# Patient Record
Sex: Male | Born: 1975 | Race: White | Hispanic: No | Marital: Married | Smoking: Current every day smoker
Health system: Southern US, Community
[De-identification: ages and names within clinical notes are randomized; demographics above are authoritative.]

## PROBLEM LIST (undated history)

## (undated) DIAGNOSIS — I10 Essential (primary) hypertension: Secondary | ICD-10-CM

## (undated) DIAGNOSIS — E079 Disorder of thyroid, unspecified: Secondary | ICD-10-CM

## (undated) DIAGNOSIS — E785 Hyperlipidemia, unspecified: Secondary | ICD-10-CM

## (undated) HISTORY — DX: Disorder of thyroid, unspecified: E07.9

## (undated) HISTORY — DX: Hyperlipidemia, unspecified: E78.5

## (undated) HISTORY — DX: Essential (primary) hypertension: I10

---

## 2014-10-29 ENCOUNTER — Ambulatory Visit: Payer: Self-pay | Admitting: Sports Medicine

## 2015-03-22 ENCOUNTER — Ambulatory Visit (INDEPENDENT_AMBULATORY_CARE_PROVIDER_SITE_OTHER): Payer: Managed Care, Other (non HMO) | Admitting: Sports Medicine

## 2015-03-22 ENCOUNTER — Encounter: Payer: Self-pay | Admitting: Sports Medicine

## 2015-03-22 ENCOUNTER — Ambulatory Visit (INDEPENDENT_AMBULATORY_CARE_PROVIDER_SITE_OTHER): Payer: Managed Care, Other (non HMO)

## 2015-03-22 VITALS — BP 128/81 | HR 84 | Ht 68.0 in | Wt 226.0 lb

## 2015-03-22 DIAGNOSIS — Z0000 Encounter for general adult medical examination without abnormal findings: Secondary | ICD-10-CM | POA: Insufficient documentation

## 2015-03-22 DIAGNOSIS — M5412 Radiculopathy, cervical region: Secondary | ICD-10-CM | POA: Insufficient documentation

## 2015-03-22 DIAGNOSIS — R05 Cough: Secondary | ICD-10-CM

## 2015-03-22 DIAGNOSIS — Z720 Tobacco use: Secondary | ICD-10-CM | POA: Diagnosis not present

## 2015-03-22 DIAGNOSIS — E785 Hyperlipidemia, unspecified: Secondary | ICD-10-CM

## 2015-03-22 DIAGNOSIS — F17200 Nicotine dependence, unspecified, uncomplicated: Secondary | ICD-10-CM | POA: Insufficient documentation

## 2015-03-22 DIAGNOSIS — R053 Chronic cough: Secondary | ICD-10-CM

## 2015-03-22 MED ORDER — CYCLOBENZAPRINE HCL 10 MG PO TABS: ORAL_TABLET | ORAL | Status: DC

## 2015-03-22 MED ORDER — MELOXICAM 15 MG PO TABS: ORAL_TABLET | ORAL | Status: AC

## 2015-03-22 MED ORDER — PREDNISONE 50 MG PO TABS: ORAL_TABLET | ORAL | Status: DC

## 2015-03-22 NOTE — Assessment & Plan Note (Signed)
Ordering routine blood work. 

## 2015-03-22 NOTE — Progress Notes (Signed)
  Subjective:    CC: Establish care.   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 He has a few complaints.  Neck and shoulder pain: left sided, periscapular with C7 and C6 distribution radiculopathy to the left hand. Moderate, persistent, better with abduction of the left shoulder, nothing in the lower extremity, no trauma.  Smoker: Not yet ready to quit.  Chronic cough: Present for over one month, nonproductive. No weight loss, fevers, chills, night sweats.  Past medical history, Surgical history, Family history not pertinant except as noted below, Social history, Allergies, and medications have been entered into the medical record, reviewed, and no changes needed.   Review of Systems: No headache, visual changes, nausea, vomiting, diarrhea, constipation, dizziness, abdominal pain, skin rash, fevers, chills, night sweats, swollen lymph nodes, weight loss, chest pain, body aches, joint swelling, muscle aches, shortness of breath, mood changes, visual or auditory hallucinations.  Objective:    General: Well Developed, well nourished, and in no acute distress.  Neuro: Alert and oriented x3, extra-ocular muscles intact, sensation grossly intact.  HEENT: Normocephalic, atraumatic, pupils equal round reactive to light, neck supple, no masses, no lymphadenopathy, thyroid nonpalpable.  Skin: Warm and dry, no rashes noted.  Cardiac: Regular rate and rhythm, no murmurs rubs or gallops.  Respiratory: Clear to auscultation bilaterally. Not using accessory muscles, speaking in full sentences.  Abdominal: Soft, nontender, nondistended, positive bowel sounds, no masses, no organomegaly.  Neck: Positive spurling's with left-sided C6 and C7 radiculopathy Full neck range of motion Grip strength and sensation normal in bilateral hands Strength good C4 to T1 distribution No sensory change to C4 to T1 Reflexes normal.  x-rays reviewed and shows C4-C5 foraminal  encroachment  From the facet  Impression and Recommendations:    The patient was counselled, risk factors were discussed, anticipatory guidance given.

## 2015-03-22 NOTE — Assessment & Plan Note (Signed)
Prednisone, meloxicam, formal physical therapy, x-rays, Flexeril at bedtime. Return in one month, MRI for interventional planning if no better.

## 2015-03-22 NOTE — Assessment & Plan Note (Signed)
Precontemplative

## 2015-03-22 NOTE — Assessment & Plan Note (Signed)
Chest x-ray, return for pre-and postbronchodilator spirometry.

## 2015-04-03 DIAGNOSIS — E785 Hyperlipidemia, unspecified: Secondary | ICD-10-CM | POA: Insufficient documentation

## 2015-04-03 LAB — COMPREHENSIVE METABOLIC PANEL
AST: 18 U/L (ref 0–37)
Albumin: 4.5 g/dL (ref 3.5–5.2)
Alkaline Phosphatase: 60 U/L (ref 39–117)
BUN: 22 mg/dL (ref 6–23)
Calcium: 9.7 mg/dL (ref 8.4–10.5)
Creat: 1.2 mg/dL (ref 0.50–1.35)
Glucose, Bld: 92 mg/dL (ref 70–99)
Total Bilirubin: 0.8 mg/dL (ref 0.2–1.2)

## 2015-04-03 LAB — TSH: TSH: 2.887 u[IU]/mL (ref 0.350–4.500)

## 2015-04-03 LAB — CBC
HCT: 50.9 % (ref 39.0–52.0)
Hemoglobin: 17.3 g/dL — ABNORMAL HIGH (ref 13.0–17.0)
MCH: 28.9 pg (ref 26.0–34.0)
MCHC: 34 g/dL (ref 30.0–36.0)
MCV: 85.1 fL (ref 78.0–100.0)
MPV: 10.3 fL (ref 8.6–12.4)
Platelets: 246 10*3/uL (ref 150–400)
RBC: 5.98 MIL/uL — ABNORMAL HIGH (ref 4.22–5.81)
RDW: 14 % (ref 11.5–15.5)
WBC: 8.6 10*3/uL (ref 4.0–10.5)

## 2015-04-03 LAB — HEMOGLOBIN A1C
Hgb A1c MFr Bld: 5.4 % (ref <5.7)
Mean Plasma Glucose: 108 mg/dL (ref <117)

## 2015-04-03 LAB — LIPID PANEL
Cholesterol: 254 mg/dL — ABNORMAL HIGH (ref 0–200)
HDL: 41 mg/dL (ref >=40)
LDL Cholesterol: 162 mg/dL — ABNORMAL HIGH (ref 0–99)
Total CHOL/HDL Ratio: 6.2 ratio
Triglycerides: 257 mg/dL — ABNORMAL HIGH (ref <150)
VLDL: 51 mg/dL — ABNORMAL HIGH (ref 0–40)

## 2015-04-03 LAB — TESTOSTERONE, FREE, TOTAL, SHBG
Sex Hormone Binding: 26 nmol/L (ref 10–50)
Testosterone, Free: 115.1 pg/mL (ref 47.0–244.0)
Testosterone-% Free: 2.4 % (ref 1.6–2.9)
Testosterone: 484 ng/dL (ref 300–890)

## 2015-04-03 LAB — COMPREHENSIVE METABOLIC PANEL WITH GFR
ALT: 35 U/L (ref 0–53)
CO2: 28 meq/L (ref 19–32)
Chloride: 99 meq/L (ref 96–112)
Potassium: 4.9 meq/L (ref 3.5–5.3)
Sodium: 135 meq/L (ref 135–145)
Total Protein: 7.3 g/dL (ref 6.0–8.3)

## 2015-04-03 LAB — VITAMIN D 25 HYDROXY (VIT D DEFICIENCY, FRACTURES): Vit D, 25-Hydroxy: 43 ng/mL (ref 30–100)

## 2015-04-04 ENCOUNTER — Ambulatory Visit: Payer: Managed Care, Other (non HMO)

## 2015-04-15 ENCOUNTER — Other Ambulatory Visit: Payer: Managed Care, Other (non HMO) | Admitting: Sports Medicine

## 2016-05-13 ENCOUNTER — Ambulatory Visit: Payer: Worker's Compensation

## 2016-05-13 ENCOUNTER — Ambulatory Visit (INDEPENDENT_AMBULATORY_CARE_PROVIDER_SITE_OTHER): Payer: Worker's Compensation | Admitting: Physician Assistant

## 2016-05-13 ENCOUNTER — Other Ambulatory Visit: Payer: Self-pay | Admitting: Radiology

## 2016-05-13 VITALS — BP 128/80 | HR 71 | Temp 98.4°F | Resp 18 | Ht 68.0 in | Wt 218.0 lb

## 2016-05-13 DIAGNOSIS — S46911A Strain of unspecified muscle, fascia and tendon at shoulder and upper arm level, right arm, initial encounter: Secondary | ICD-10-CM

## 2016-05-13 DIAGNOSIS — M25511 Pain in right shoulder: Secondary | ICD-10-CM

## 2016-05-13 DIAGNOSIS — M5412 Radiculopathy, cervical region: Secondary | ICD-10-CM

## 2016-05-13 MED ORDER — CYCLOBENZAPRINE HCL 10 MG PO TABS: ORAL_TABLET | ORAL | 0 refills | Status: AC

## 2016-05-13 MED ORDER — MELOXICAM 15 MG PO TABS: 15.0000 mg | ORAL_TABLET | Freq: Every day | ORAL | 0 refills | Status: DC

## 2016-05-13 MED ORDER — CYCLOBENZAPRINE HCL 10 MG PO TABS: 10.0000 mg | ORAL_TABLET | Freq: Three times a day (TID) | ORAL | 0 refills | Status: AC | PRN

## 2016-05-13 MED ORDER — MELOXICAM 15 MG PO TABS: 15.0000 mg | ORAL_TABLET | Freq: Every day | ORAL | 0 refills | Status: AC

## 2016-05-13 NOTE — Progress Notes (Signed)
Gary Clay Jan 25, 1976 40 y.o.   Chief Complaint  Patient presents with  . Shoulder Injury    today, right     Date of Injury: today 4pm 7.26.2017  History of Present Illness:  Presents for evaluation of work-related complaint.  --Patient was using an ear hammer, and push through tool went into shoulder--so he had felt, though his coworkers did not visualize this.  pain was instant, and he dropped down.  It is at the shoulder and the arm of his left side which he describes as shooting.  No numbness or tingling.  He has noticed no bleeding or bruising.  There is significant muscle spasms at the right shoulder.  Marland Kitchen   Anterior arm at the bicep and down.  .    ROS ROS otherwise unremarkable unless listed above.   Current medications and allergies reviewed and updated. Past medical history, family history, social history have been reviewed and updated.   Physical Exam  Constitutional: He is well-developed, well-nourished, and in no distress. No distress.  Eyes: Conjunctivae are normal. Pupils are equal, round, and reactive to light.  Cardiovascular: Normal rate and regular rhythm.   Pulmonary/Chest: Effort normal. No respiratory distress. He has no wheezes.  Musculoskeletal:       Cervical back: He exhibits normal range of motion, no tenderness and no bony tenderness.  No erythema, swelling, or abrasion found.  Pectoral tenerness just adjacent to the right shoulder.  Biceps tendon pain.  No swelling at the bicep.  Decreased range of motion secondary to pain.   Skin: Skin is warm and dry. He is not diaphoretic.  Psychiatric: Mood and affect normal.   Dg Shoulder Right  Result Date: 05/13/2016 CLINICAL DATA:  Right shoulder pain EXAM: RIGHT SHOULDER - 2+ VIEW COMPARISON:  None. FINDINGS: No evidence of acute fracture or dislocation. Mild irregularity involving the distal/lateral clavicle and AC joint, well corticated and likely chronic, suggesting the sequela of prior trauma.  Visualized soft tissues are within normal limits. Visualized right lung is clear. IMPRESSION: Chronic changes involving the AC joint, suggesting the sequela of prior trauma. No evidence of acute fracture or dislocation. Electronically Signed   By: Charline Bills M.D.   On: 05/13/2016 18:19  Assessment and Plan: 40 year old male is here today for shoulder and right upper arm pain. Given muscle relaxant and nsaid.  rtc in 3 days for follow up.  Possible biceps tendon, pectoral strain Right shoulder pain - Plan: DG Shoulder Right, cyclobenzaprine (FLEXERIL) 10 MG tablet, DISCONTINUED: meloxicam (MOBIC) 15 MG tablet  Muscle strain of right upper arm, initial encounter - Plan: DG Shoulder Right, cyclobenzaprine (FLEXERIL) 10 MG tablet, DISCONTINUED: meloxicam (MOBIC) 15 MG tablet  Trena Platt, PA-C [facility] 8/13/20178:07 PM

## 2016-05-13 NOTE — Patient Instructions (Addendum)
     IF you received an x-ray today, you will receive an invoice from [facility]. Please contact [facility] at [phone] with questions or concerns regarding your invoice.   IF you received labwork today, you will receive an invoice from [employer]. Please contact [facility] at [phone] with questions or concerns regarding your invoice.   Our billing staff will not be able to assist you with questions regarding bills from these companies.  You will be contacted with the lab results as soon as they are available. The fastest way to get your results is to activate your My Chart account. Instructions are located on the last page of this paperwork. If you have not heard from [place] regarding the results in 2 weeks, please contact this office.     Please take the mobic daily.  Do not take the ibuprofen, or the naproxen daily.  You are able to take tylenol. Please take the flexeril as needed.  You are able to split it in half.   Stay in the splint.  Make sure you are placing your weight into the splint.   If your shoulder is feeling a lot better, you may come out of the splint and do the range of motion as we discussed (pendulum, wall climbing).

## 2016-05-16 ENCOUNTER — Ambulatory Visit (INDEPENDENT_AMBULATORY_CARE_PROVIDER_SITE_OTHER): Payer: Worker's Compensation | Admitting: Family Medicine

## 2016-05-16 VITALS — BP 116/76 | HR 81 | Temp 98.2°F | Resp 18 | Ht 68.0 in | Wt 219.2 lb

## 2016-05-16 DIAGNOSIS — R200 Anesthesia of skin: Secondary | ICD-10-CM

## 2016-05-16 DIAGNOSIS — T148XXA Other injury of unspecified body region, initial encounter: Secondary | ICD-10-CM

## 2016-05-16 DIAGNOSIS — M25511 Pain in right shoulder: Secondary | ICD-10-CM

## 2016-05-16 DIAGNOSIS — R202 Paresthesia of skin: Secondary | ICD-10-CM

## 2016-05-16 NOTE — Patient Instructions (Addendum)
   I have ordered an MRI of your shoulder, hopefully this can be done early next week, as well as a referral to orthopedics. For now continue the shoulder sling, meloxicam if needed for pain, and Flexeril up to every 8 hours. If you need something stronger for pain, please let me know.  Continue to avoid use of right arm at work. If you have not seen orthopedics within 1 week, return to recheck with me.  IF you received an x-ray today, you will receive an invoice from [facility]. Please contact [facility] at [phone] with questions or concerns regarding your invoice.   IF you received labwork today, you will receive an invoice from [employer]. Please contact [facility] at [phone] with questions or concerns regarding your invoice.   Our billing staff will not be able to assist you with questions regarding bills from these companies.  You will be contacted with the lab results as soon as they are available. The fastest way to get your results is to activate your My Chart account. Instructions are located on the last page of this paperwork. If you have not heard from [place] regarding the results in 2 weeks, please contact this office.

## 2016-05-16 NOTE — Progress Notes (Addendum)
Subjective:  By signing my name below, I, [name], attest that this documentation has been prepared under the direction and in the presence of Meredith Staggers, MD. Electronically Signed: [name], Scribe. 05/16/2016 , 2:19 PM .  Patient was seen in Room 4 .   Patient ID: Gary Clay, male    DOB: [birth year], 40 y.o.   MRN: [id number] Chief Complaint  Patient presents with   Follow-up    W/C follow up for right shoulder pain   HPI Gary Clay is a 40 y.o. male Here for follow up of right shoulder pain due to injury at work. Date of injury 05/13/16. He was seen 05/13/16 by Trena Platt, PA. I was also asked to evaluate his shoulder. He felt immediate pain in right shoulder after using an air hammer with pain extending into the right upper arm. He was placed into a sling, started meloxicam 15mg  qd, flexeril tid, and work restrictions (no use of the right arm). He had an xray of his right shoulder, indicating chronic changes of the [facility] joint but no acute fracture or dislocation.   Patient has been taking meloxicam qd and flexeril tid as instructed. He's had improvement but specific movements causes him pain; ie, when he's reaching back behind him. He mentions having sedation with the medications. He's also been wearing his sling. He's noticed bruising over his right upper arm now.   He works as a Teaching laboratory technician.   No Known Allergies   Prior to Admission medications   Medication Sig Start Date End Date Taking? Authorizing Provider  cyclobenzaprine (FLEXERIL) 10 MG tablet Take 1 tablet (10 mg total) by mouth 3 (three) times daily as needed for muscle spasms. 05/13/16  Yes Collie Siad English, PA  cyclobenzaprine (FLEXERIL) 10 MG tablet One half tab PO qHS, then increase gradually to one tab TID. 05/13/16  Yes Collie Siad English, PA  meloxicam (MOBIC) 15 MG tablet Take 1 tablet (15 mg total) by mouth daily. 05/13/16  Yes Stephanie D English, PA  meloxicam (MOBIC) 15 MG tablet One  tab PO qAM with breakfast for 2 weeks, then daily prn pain. Patient not taking: Reported on 05/13/2016 03/22/15   Monica Becton, MD   Review of Systems  Constitutional: Negative for chills, fatigue and fever.  Gastrointestinal: Negative for diarrhea, nausea and vomiting.  Musculoskeletal: Positive for arthralgias and myalgias. Negative for back pain.  Neurological: Positive for numbness. Negative for dizziness, weakness and headaches.       Objective:   Physical Exam  Constitutional: He is oriented to person, place, and time. He appears well-developed and well-nourished. No distress.  HENT:  Head: Normocephalic and atraumatic.  Eyes: EOM are normal. Pupils are equal, round, and reactive to light.  Neck: Neck supple.  Cardiovascular: Normal rate.   Pulmonary/Chest: Effort normal. No respiratory distress.  Musculoskeletal: Normal range of motion.  Visual exam: some asymmetry with possible slight sulcus anterior right shoulder, some ecchymosis anterior right upper arm overlying the proximal biceps.  Upper pectoralis non tender, tenderness in right axilla, has some decreased sensation right anterior upper arm, skin intact.  Clavicle,  and AC non tender, full flexion but pain with supination of arm, positive speed test, positive yergason,  tightness with abduction, guarded internal rotation, negative empty can test, NVI distally  Neurological: He is alert and oriented to person, place, and time.  Skin: Skin is warm and dry.  Psychiatric: He has a normal mood and affect. His behavior is normal.  Nursing note and vitals reviewed.   Vitals:   05/16/16 1339  BP: 116/76  Pulse: 81  Resp: 18  Temp: 98.2 F (36.8 C)  TempSrc: Oral  SpO2: 98%  Weight: 219 lb 3.2 oz (99.4 kg)  Height:  (1.727 m)      Assessment & Plan:     Gary Clay is a 40 y.o. male Pain in joint of right shoulder - Plan: MR Shoulder Right Wo Contrast, Ambulatory referral to Orthopedic  Surgery  Numbness and tingling of right upper extremity - Plan: MR Shoulder Right Wo Contrast, Ambulatory referral to Orthopedic Surgery  Bruising - Plan: MR Shoulder Right Wo Contrast, Ambulatory referral to Orthopedic Surgery  Here for follow-up of injury sustained at work on July 26. Right shoulder pain with bruising now noted and some anterior upper arm dysesthesia. No apparent fracture or acute findings on initial shoulder x-ray. Neurovascular intact distally. Based on exam and location of ecchymosis, differential includes labral tear, or biceps tendon tear proximally with labral tear.  RTC tear also possible.  - MRI of right shoulder ordered, will also start referral to orthopedics for further evaluation.  -Continue shoulder sling, meloxicam and Flexeril as needed. Advised let me know if he needs something stronger for pain in the interim.  -RTC precautions discussed, handout given and letter for work provided including precautions if sedating medications taken while working.  No orders of the defined types were placed in this encounter.  Patient Instructions     I have ordered an MRI of your shoulder, hopefully this can be done early next week, as well as a referral to orthopedics. For now continue the shoulder sling, meloxicam if needed for pain, and Flexeril up to every 8 hours. If you need something stronger for pain, please let me know.  Continue to avoid use of right arm at work. If you have not seen orthopedics within 1 week, return to recheck with me.  IF you received an x-ray today, you will receive an invoice from [facility]. Please contact [facility] at [phone] with questions or concerns regarding your invoice.   IF you received labwork today, you will receive an invoice from [employer]. Please contact [facility] at [phone] with questions or concerns regarding your invoice.   Our billing staff will not be able to assist  you with questions regarding bills from these companies.  You will be contacted with the lab results as soon as they are available. The fastest way to get your results is to activate your My Chart account. Instructions are located on the last page of this paperwork. If you have not heard from [place] regarding the results in 2 weeks, please contact this office.        I personally performed the services described in this documentation, which was scribed in my presence. The recorded information has been reviewed and considered, and addended by me as needed.   Signed,   Meredith Staggers, MD [facility].  05/16/16 2:47 PM

## 2016-05-19 ENCOUNTER — Telehealth: Payer: Self-pay

## 2016-05-19 NOTE — Telephone Encounter (Signed)
The patient's wife called for advise on the patient's condition.  She said the bruising has gotten worse - it is now also around the back of his arm, and onto his back.  She wants to know if there is a cause for concern.  Please advise, thank you.  CB# [phone]

## 2016-05-20 NOTE — Telephone Encounter (Signed)
Spoke with pts wife and she stated they are seeing orthopedist today.

## 2016-05-23 ENCOUNTER — Ambulatory Visit
Admission: RE | Admit: 2016-05-23 | Discharge: 2016-05-23 | Disposition: A | Discharge status: Home / Self Care | Payer: Worker's Compensation | Source: Ambulatory Visit | Attending: Family Medicine | Admitting: Family Medicine

## 2016-05-23 DIAGNOSIS — M25511 Pain in right shoulder: Secondary | ICD-10-CM

## 2016-05-23 DIAGNOSIS — R202 Paresthesia of skin: Secondary | ICD-10-CM

## 2016-05-23 DIAGNOSIS — T148XXA Other injury of unspecified body region, initial encounter: Secondary | ICD-10-CM

## 2016-05-23 DIAGNOSIS — R200 Anesthesia of skin: Secondary | ICD-10-CM

## 2016-05-23 DIAGNOSIS — S29011A Strain of muscle and tendon of front wall of thorax, initial encounter: Secondary | ICD-10-CM

## 2016-05-26 ENCOUNTER — Other Ambulatory Visit: Payer: Self-pay | Admitting: Family Medicine

## 2016-05-26 DIAGNOSIS — S29011A Strain of muscle and tendon of front wall of thorax, initial encounter: Secondary | ICD-10-CM

## 2016-05-30 ENCOUNTER — Other Ambulatory Visit: Payer: Self-pay | Admitting: Specialist

## 2016-05-30 DIAGNOSIS — S4991XD Unspecified injury of right shoulder and upper arm, subsequent encounter: Secondary | ICD-10-CM

## 2016-06-05 ENCOUNTER — Ambulatory Visit
Admission: RE | Admit: 2016-06-05 | Discharge: 2016-06-05 | Disposition: A | Discharge status: Home / Self Care | Payer: Self-pay | Source: Ambulatory Visit | Attending: Specialist | Admitting: Specialist

## 2016-06-05 DIAGNOSIS — S4991XD Unspecified injury of right shoulder and upper arm, subsequent encounter: Secondary | ICD-10-CM

## 2016-08-13 ENCOUNTER — Ambulatory Visit: Payer: Self-pay | Attending: Specialist | Admitting: Physical Therapy

## 2016-08-13 DIAGNOSIS — M25611 Stiffness of right shoulder, not elsewhere classified: Secondary | ICD-10-CM | POA: Insufficient documentation

## 2016-08-13 DIAGNOSIS — M25511 Pain in right shoulder: Secondary | ICD-10-CM | POA: Insufficient documentation

## 2016-08-13 NOTE — Patient Instructions (Signed)
Pendulum Circular    Bend forward 90 at waist, leaning on table for support. Rock body in a circular pattern to move arm clockwise __10-15__ times then counterclockwise __10-15__ times. Do __2-3__ sessions per day.  Copyright  VHI. All rights reserved.   Pendulum Forward/Back    Bend forward 90 at waist, using table for support. Rock body forward and back to swing arm. Repeat __10-15__ times. Do _10-15___ sessions per day.  Copyright  VHI. All rights reserved.   Pendulum Side to Side    Bend forward 90 at waist, leaning on table for support. Rock body from side to side and let arm swing freely. Repeat _10-15___ times. Do __2-3__ sessions per day.  Copyright  VHI. All rights reserved.     When arm is out of sling bend and straighten elbow, move wrist, squeeze stress ball.

## 2016-08-13 NOTE — Therapy (Signed)
[facility] [address]  [address] [place], [place], [address] Phone: [phone]   Fax:  [phone]  Physical Therapy Evaluation  Patient Details  Name: Gary Clay MRN: [id number] Date of Birth: 05/23/1976 Referring Provider: Dr. Elana Alm. Collins  Encounter Date: 08/13/2016      PT End of Session - 08/13/16 1411    Visit Number 1   Number of Visits 16   Date for PT Re-Evaluation 10/08/16   Authorization Type self pay   PT Start Time 1323   PT Stop Time 1400   PT Time Calculation (min) 37 min   Activity Tolerance Patient tolerated treatment well   Behavior During Therapy WFL for tasks assessed/performed      Past Medical History:  Diagnosis Date  . Hyperlipidemia   . Hypertension   . Thyroid disease     No past surgical history on file.  There were no vitals filed for this visit.       Subjective Assessment - 08/13/16 1326    Subjective Patient is a 40 y/o M presenting to OPPT s/p surgical repair of pectoralis major muscle rupture on 06/30/16 after using an air hammer at work. Patient reporting only minor pain daily with ability to sleep without brace.  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    Limitations Lifting;House hold activities   Diagnostic tests MRI - tear of pec major at tendons attachment on humerus    Patient Stated Goals get back to work, regain motion   Currently in Pain? Yes   Pain Score 1    Pain Location Shoulder   Pain Orientation Right   Pain Descriptors / Indicators Aching;Dull  has periods of sharp and stabbing if moving incorrectly or if accidental muscle activation   Pain Type Surgical pain   Pain Onset More than a month ago   Pain Frequency Intermittent            [facility] PT Assessment - 08/13/16 1328      Assessment   Medical Diagnosis Rupture of pec major muscle s/p repair   Referring Provider Dr. Elana Alm. Collins   Onset  Date/Surgical Date 06/30/16   Hand Dominance Right   Next MD Visit 08/26/16     Precautions   Precaution Comments gentle ROM, no PREs or resistance until 3 mo after surgery     Balance Screen   Has the patient fallen in the past 6 months No   Has the patient had a decrease in activity level because of a fear of falling?  No   Is the patient reluctant to leave their home because of a fear of falling?  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#, wrenching, overhead activities for prolonged times      Cognition   Overall Cognitive Status Within Functional Limits for tasks assessed     Observation/Other Assessments   Focus on Therapeutic Outcomes (FOTO)  51 (49% limited, predcited 37% limited)     PROM   PROM Assessment Site Shoulder   Right/Left Shoulder Right   Right Shoulder Extension 40 Degrees   Right Shoulder Flexion 130 Degrees   Right Shoulder ABduction 124 Degrees  Right Shoulder Internal Rotation 68 Degrees   Right Shoulder External Rotation 90 Degrees     Strength   Strength Assessment Site Elbow   Right/Left Elbow Right   Right Elbow Flexion 5/5   Right Elbow Extension 5/5                   [facility] Adult PT Treatment/Exercise - 08/13/16 1409      Self-Care   Self-Care Other Self-Care Comments   Other Self-Care Comments  instructed in elbow flexion and extension, grip strength, forearm supination/pronation     Shoulder Exercises: ROM/Strengthening   Pendulum CW/CCW, forwards/backwards, side to side; 10-15 reviewed and given as HEP     Manual Therapy   Manual Therapy Passive ROM   Passive ROM passive ROM of R shoulder into flexion, abduction, and IR for gentle stretching; 10 x 5" holds; no increased pain                 PT Education - 08/13/16 1411    Education provided Yes   Education Details initial HEP with handout given   Person(s) Educated Patient   Methods Explanation;Demonstration;Handout   Comprehension Verbalized understanding;Returned demonstration          PT Short Term Goals - 08/13/16 1423      PT SHORT TERM GOAL #1   Title patient to be independent with HEP (08/27/16)   Time 2   Period Weeks   Status New     PT SHORT TERM GOAL #2   Title Patient to demonstrate R shoulder PROM to WNL (09/10/16)   Time 4   Period Weeks   Status New           PT Long Term Goals - 08/13/16 1424      PT LONG TERM GOAL #1   Title Patient to be independent with advanced HEP (10/08/16)   Time 8   Period Weeks   Status New     PT LONG TERM GOAL #2   Title Patient to demonstrate full R shoulder AROM to facilitate improved functional mobility (10/08/16)   Time 8   Period Weeks   Status New     PT LONG TERM GOAL #3   Title Patient to tolerate overhead activity for 1 minute in preparation for return to work (10/08/16)   Time 8   Period Weeks   Status New               Plan - 08/13/16 1413    Clinical Impression Statement Gary Clay is a 40 y/o M presenting to OPPT s/p surgical repair of pec major muscle rupture from attachment on humerus. Patient today demonstrating reduced PROM into flexion, abduction, and IR with abduction most limited. Patient currently with precautions including gentle ROM, no PREs or resistive exercises. Messages sent to MD regarding limitations on AROM vs PROM. Patient to benefit from skilled PT intervention to address ROM and strength of R shoulder to allow patient to regain functional mobility of shoulder as well as to return to work without limitations.    Rehab Potential Good   PT Frequency 2x / week   PT Duration 8 weeks   PT Treatment/Interventions ADLs/Self Care Home Management;Cryotherapy;Electrical Stimulation;Moist Heat;Ultrasound;Therapeutic  exercise;Therapeutic activities;Patient/family education;Manual techniques;Dry needling;Taping;Vasopneumatic Device;Passive range of motion;Scar mobilization   PT Next Visit Plan progress ROM; awaiting to hear from MD for hopeful progression to AAROM and AROM   Consulted and Agree with Plan of Care Patient      Patient  will benefit from skilled therapeutic intervention in order to improve the following deficits and impairments:  Pain, Decreased strength, Decreased range of motion, Decreased activity tolerance, Increased edema, Impaired UE functional use  Visit Diagnosis: Acute pain of right shoulder - Plan: PT plan of care cert/re-cert  Stiffness of right shoulder, not elsewhere classified - Plan: PT plan of care cert/re-cert     Problem List Patient Active Problem List   Diagnosis Date Noted  . Hyperlipidemia LDL goal <100 04/03/2015  . Annual physical exam 03/22/2015  . Left cervical radiculopathy 03/22/2015  . Smoker 03/22/2015  . Chronic cough 03/22/2015    Kipp LaurenceStephanie R Advait Buice, PT, DPT 08/13/16 2:43 PM   Agree with note.  Clarita CraneStephanie F Matthews, PT, DPT 08/13/16 2:44 PM   [facility] [place] [address]  [address] [place], [place], [address] Phone: [phone]   Fax:  [phone]  Name: Frutoso Schatzhor Carvell MRN: [id number] Date of Birth: 05/18/1976

## 2016-08-17 ENCOUNTER — Ambulatory Visit: Payer: Self-pay

## 2016-08-18 ENCOUNTER — Ambulatory Visit: Payer: Self-pay

## 2016-08-20 ENCOUNTER — Ambulatory Visit: Payer: Self-pay | Attending: Specialist

## 2016-08-20 DIAGNOSIS — M25611 Stiffness of right shoulder, not elsewhere classified: Secondary | ICD-10-CM | POA: Insufficient documentation

## 2016-08-20 DIAGNOSIS — M25511 Pain in right shoulder: Secondary | ICD-10-CM | POA: Insufficient documentation

## 2016-08-20 NOTE — Therapy (Signed)
[facility] [place] [address]  [address] [place], [place], [address] Phone: [phone]   Fax:  [phone]  Physical Therapy Treatment  Patient Details  Name: Gary Clay MRN: [id number] Date of Birth: 07/09/1976 Referring Provider: Dr. Molly Madurorobert A. Collins   Encounter Date: 08/20/2016      PT End of Session - 08/20/16 1416    Visit Number 2   Number of Visits 16   Date for PT Re-Evaluation 10/08/16   Authorization Type self pay   PT Start Time 1405   PT Stop Time 1444   PT Time Calculation (min) 39 min   Activity Tolerance Patient tolerated treatment well   Behavior During Therapy WFL for tasks assessed/performed      Past Medical History:  Diagnosis Date  . Hyperlipidemia   . Hypertension   . Thyroid disease     No past surgical history on file.  There were no vitals filed for this visit.      Subjective Assessment - 08/20/16 1446    Subjective Pt. reporting chief complaint at this point is multiple pain sites in upper shoulder (UT).   Patient Stated Goals get back to work, regain motion   Currently in Pain? 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 Molly Madurorobert A. Collins    Onset Date/Surgical Date 06/30/16   Hand Dominance Right   Next MD Visit 08/26/16       Today's treatment:  Manual:  Seated R UT DTM/TPR x 10 min; this was pt.'s chief complaint today  Gentle R shoulder PROM all directions L sidelying R scapular mobs all directions x 2 min  R anterior shoulder/pec STM    Modalities: Moist heat R shoulder: supine, 10 min             PT Short Term Goals  - 08/20/16 1428      PT SHORT TERM GOAL #1   Title patient to be independent with HEP (08/27/16)   Time 2   Period Weeks   Status On-going     PT SHORT TERM GOAL #2   Title Patient to demonstrate R shoulder PROM to WNL (09/10/16)   Time 4   Period Weeks   Status On-going           PT Long Term Goals - 08/20/16 1428      PT LONG TERM GOAL #1   Title Patient to be independent with advanced HEP (10/08/16)   Time 8   Period Weeks   Status On-going     PT LONG TERM GOAL #2   Title Patient to demonstrate full R shoulder AROM to facilitate improved functional mobility (10/08/16)   Time 8   Period Weeks   Status On-going     PT LONG TERM GOAL #3   Title Patient to tolerate overhead activity for 1 minute in preparation for return to work (10/08/16)   Time 8   Period Weeks   Status On-going  Plan - 08/20/16 1427    Clinical Impression Statement Pt. chief complaint today was R UT pains which are worst, "during activities".   Significant time taken today for R UT DTM/TPR with good response and complete pain relief.  Still awaiting to hear from MD for hopeful progression to [facility], thus only PROM performed at R shoulder and scapula.  Pt. with good motion at scapula and smooth PROM at R shoulder without muscular guarding today.  Will continue pending MD answer in regards to ROM details.   PT Treatment/Interventions ADLs/Self Care Home Management;Cryotherapy;Electrical Stimulation;Moist Heat;Ultrasound;Therapeutic exercise;Therapeutic activities;Patient/family education;Manual techniques;Dry needling;Taping;Vasopneumatic Device;Passive range of motion;Scar mobilization   PT Next Visit Plan progress ROM; awaiting to hear from MD for hopeful progression to [facility]      Patient will benefit from skilled therapeutic intervention in order to improve the following deficits and impairments:  Pain, Decreased strength, Decreased range of motion, Decreased  activity tolerance, Increased edema, Impaired UE functional use  Visit Diagnosis: Acute pain of right shoulder  Stiffness of right shoulder, not elsewhere classified     Problem List Patient Active Problem List   Diagnosis Date Noted  . Hyperlipidemia LDL goal <100 04/03/2015  . Annual physical exam 03/22/2015  . Left cervical radiculopathy 03/22/2015  . Smoker 03/22/2015  . Chronic cough 03/22/2015    Kermit BaloMicah Brylyn Novakovich, PTA 08/20/16 3:45 PM  [facility] [place] [address]  [address] [place], [place], [address] Phone: [phone]   Fax:  [phone]  Name: Gary Clay MRN: [id number] Date of Birth: 03/02/1976

## 2016-08-26 ENCOUNTER — Ambulatory Visit: Payer: Self-pay | Admitting: Physical Therapy

## 2016-08-26 DIAGNOSIS — M25611 Stiffness of right shoulder, not elsewhere classified: Secondary | ICD-10-CM

## 2016-08-26 DIAGNOSIS — M25511 Pain in right shoulder: Secondary | ICD-10-CM

## 2016-08-26 NOTE — Therapy (Addendum)
[facility] [address]  [place], [place], [address] Phone: [phone]   Fax:  [phone]  Physical Therapy Treatment  Patient Details  Name: Gary Clay MRN: [id number] Date of Birth: Mar 05, 1976 Referring Provider: Dr. Herbie Baltimore A. Collins   Encounter Date: 08/26/2016      PT End of Session - 08/26/16 1447    Visit Number 3   Number of Visits 16   Date for PT Re-Evaluation 10/08/16   Authorization Type self pay   PT Start Time 1400   PT Stop Time 1441   PT Time Calculation (min) 41 min   Activity Tolerance Patient tolerated treatment well   Behavior During Therapy WFL for tasks assessed/performed      Past Medical History:  Diagnosis Date  . Hyperlipidemia   . Hypertension   . Thyroid disease     No past surgical history on file.  There were no vitals filed for this visit.      Subjective Assessment - 08/26/16 1402    Subjective Patient saw MD earlier today - reports he is out of the sling, can perform active movements of shoulder with no weights. Md wants return of normal ROM. Return to MD on 09/23/16   Limitations Lifting;House hold activities   Diagnostic tests MRI - tear of pec major at tendons attachment on humerus    Patient Stated Goals get back to work, regain motion   Currently in Pain? Yes   Pain Score 1    Pain Location Shoulder   Pain Orientation Right   Pain Descriptors / Indicators Tightness   Pain Type Surgical pain   Pain Onset More than a month ago   Pain Frequency Intermittent                         [facility] Adult PT Treatment/Exercise - 08/26/16 1404      Shoulder Exercises: Supine   External Rotation AAROM;15 reps   External Rotation Limitations with cane, near full ROM with elbow at side   Flexion AAROM;15 reps   Flexion Limitations with cane. to approx 150 degress, limited due to pain/tightness   ABduction AAROM;15 reps   ABduction Limitations with cane  to approx 75 degrees, pain/tightness limiting movement     Shoulder Exercises: Sidelying   External Rotation Right;15 reps   External Rotation Limitations full range     Shoulder Exercises: Standing   Flexion 15 reps  5-10 second hold .   Flexion Limitations wall ladder   Retraction 15 reps  5 second hold     Shoulder Exercises: Pulleys   Flexion 3 minutes   ABduction 3 minutes     Shoulder Exercises: Therapy Ball   Flexion 15 reps   Flexion Limitations gentle overpressure at end range; orange physioball on mat table with patient seated                PT Education - 08/26/16 1446    Education provided Yes   Education Details progress to cane AAROM, perform wall wash at home   Person(s) Educated Patient   Methods Explanation;Demonstration   Comprehension Verbalized understanding;Returned demonstration          PT Short Term Goals - 08/20/16 1428      PT SHORT TERM GOAL #1   Title patient to be independent with HEP (08/27/16)   Time 2   Period Weeks   Status On-going  PT SHORT TERM GOAL #2   Title Patient to demonstrate R shoulder PROM to WNL (09/10/16)   Time 4   Period Weeks   Status On-going           PT Long Term Goals - 08/20/16 1428      PT LONG TERM GOAL #1   Title Patient to be independent with advanced HEP (10/08/16)   Time 8   Period Weeks   Status On-going     PT LONG TERM GOAL #2   Title Patient to demonstrate full R shoulder AROM to facilitate improved functional mobility (10/08/16)   Time 8   Period Weeks   Status On-going     PT LONG TERM GOAL #3   Title Patient to tolerate overhead activity for 1 minute in preparation for return to work (10/08/16)   Time 8   Period Weeks   Status On-going               Plan - 08/26/16 1448    Clinical Impression Statement Patient returned to MD today for 8 week follow-up, with instructions to begin AA/AROM as tolerated with continued restrictions regarding no resistive or  weighted exercise - patient also to no longer be in sling. PT attempted to call MD again today, prior to start of treatment session to clarify ROM orders, with no response from MD, however, patient verbalizing to PT he is free to move R UE actively with no weights. Patient initiating AA/AROM activities with AA abduction being most limited. Patient with near full flexion and ER AAROM. Patient to continue to benefit from PT to progress functional use of R UE.    PT Treatment/Interventions ADLs/Self Care Home Management;Cryotherapy;Electrical Stimulation;Moist Heat;Ultrasound;Therapeutic exercise;Therapeutic activities;Patient/family education;Manual techniques;Dry needling;Taping;Vasopneumatic Device;Passive range of motion;Scar mobilization   PT Next Visit Plan progress ROM towards more active movements   Consulted and Agree with Plan of Care Patient      Patient will benefit from skilled therapeutic intervention in order to improve the following deficits and impairments:  Pain, Decreased strength, Decreased range of motion, Decreased activity tolerance, Increased edema, Impaired UE functional use  Visit Diagnosis: Acute pain of right shoulder  Stiffness of right shoulder, not elsewhere classified     Problem List Patient Active Problem List   Diagnosis Date Noted  . Hyperlipidemia LDL goal <100 04/03/2015  . Annual physical exam 03/22/2015  . Left cervical radiculopathy 03/22/2015  . Smoker 03/22/2015  . Chronic cough 03/22/2015      Lanney Gins, PT, DPT 08/26/16 2:56 PM   PHYSICAL THERAPY DISCHARGE SUMMARY  Visits from Start of Care: 3  Current functional level related to goals / functional outcomes: See above; limited ROM and strength - unable to assess due to patient not returning since last visit   Remaining deficits: Limited ROM and strength   Education / Equipment: HEP  Plan: Patient agrees to discharge.  Patient goals were not met. Patient is being  discharged due to not returning since the last visit.  ?????    Patient has been d/c form PT as patient has not been seen since last treatment session, which is greater than 30 days.  Per patient report, MD said he could stop PT.   Patient to need new referral if needing to return.    Lanney Gins, PT, DPT 10/16/16 8:08 AM  [facility] [place], [place], [address] Phone: [phone]   Fax:  [phone]  Name: Chrisangel Eskenazi MRN: [id number] Date of Birth: [birth year]

## 2016-09-03 ENCOUNTER — Ambulatory Visit: Payer: Self-pay

## 2016-09-09 ENCOUNTER — Ambulatory Visit: Payer: Self-pay

## 2017-10-22 IMAGING — MR MR CHEST MEDIASTINUM W/O CM
5 series · 16 of 16 positions shown · non-contrast
Comparison: 05/23/2016 MR shoulder

ADDENDUM:
Complete tear of the right pectoralis major tendon with 6 cm of
retraction. The tear appears to be at the humeral insertion.
CLINICAL DATA: Four weeks ago patient was using an air gun at work.
He had his arm flexed as he moved the gun forward. He felt intense
pain at that time. Recent, previous right shoulder MR shows signs of
a pectoralis tear.

EXAM:
MRI CHEST WITHOUT CONTRAST
TECHNIQUE: Multiplanar, multisequence MR imaging was performed. No intravenous
contrast was administered.

[Series 3: T1 · axial · right · 4.0mm · 0.78mm/px · z∈[-139,+75]mm · 3 of 43 slices shown (1 of 2)]
[im 1/43]
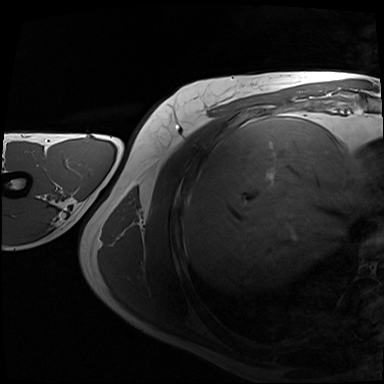
[im 22/43]
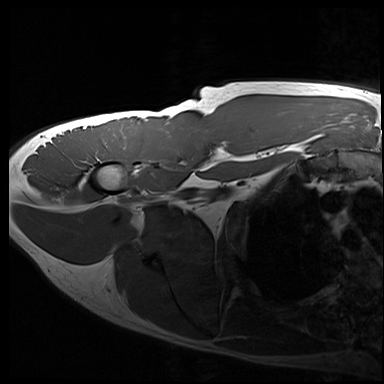
[im 43/43]
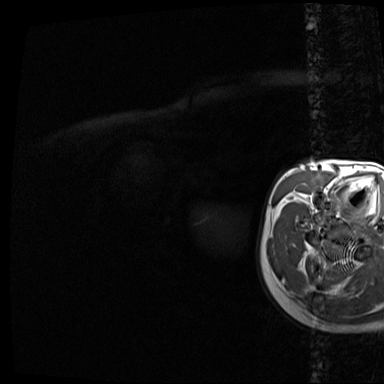

[Series 4: T2 fat-sat · axial · right · 4.0mm · 0.78mm/px · z∈[-139,+75]mm · 3 of 43 slices shown]
[im 1/43]
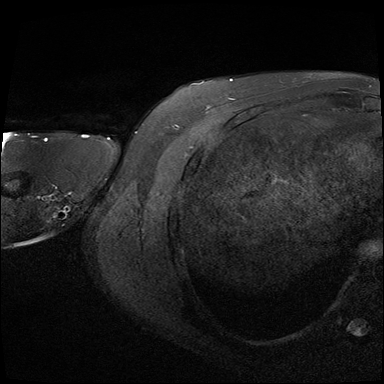
[im 22/43]
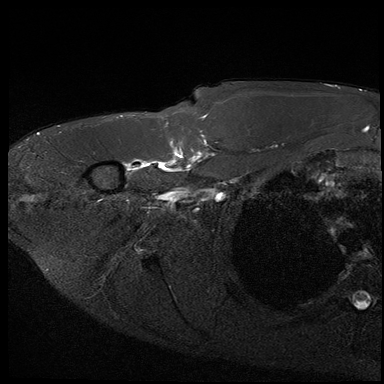
[im 43/43]
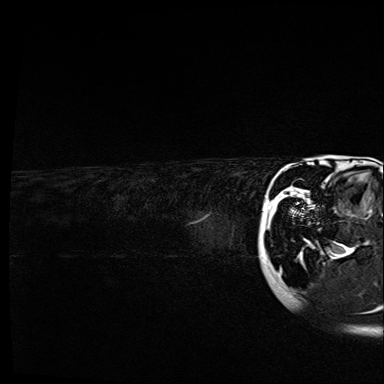

[Series 5: T1 · coronal · right · 4.0mm · 0.78mm/px · 3 of 33 slices shown (2 of 2)]
[im 1/33]
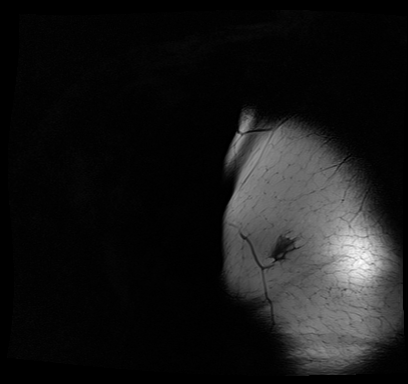
[im 17/33]
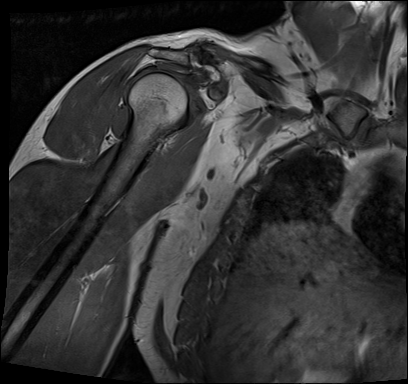
[im 33/33]
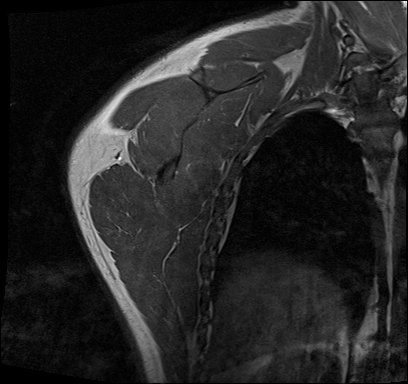

[Series 7: STIR · coronal · right · 4.0mm · 0.78mm/px · 3 of 33 slices shown]
[im 1/33]
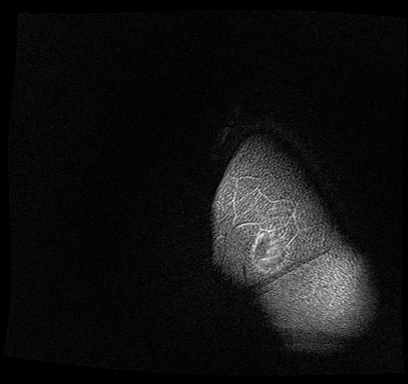
[im 17/33]
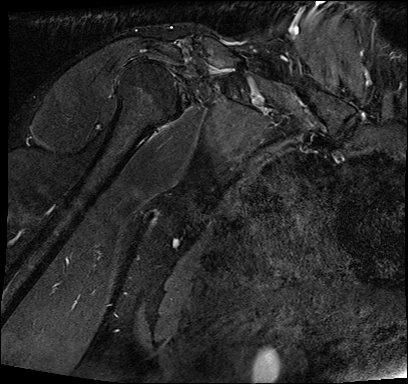
[im 33/33]
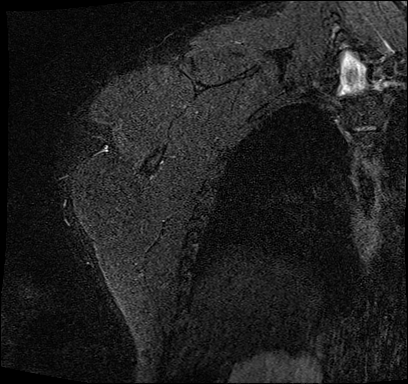

[Series 8: PD fat-sat · sagittal · right · 4.0mm · 0.65mm/px · 4 of 50 slices shown]
[im 1/50]
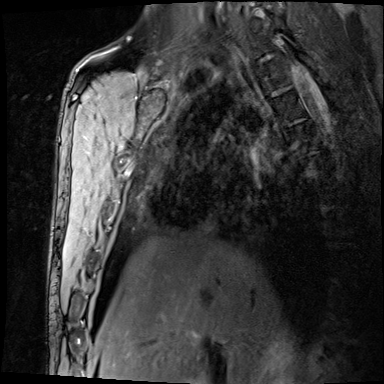
[im 17/50]
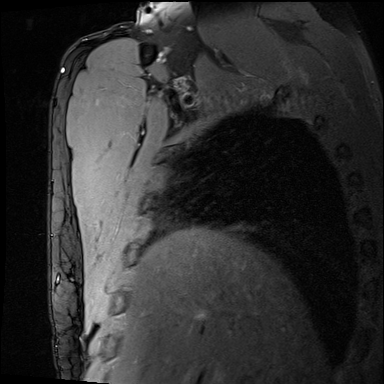
[im 33/50]
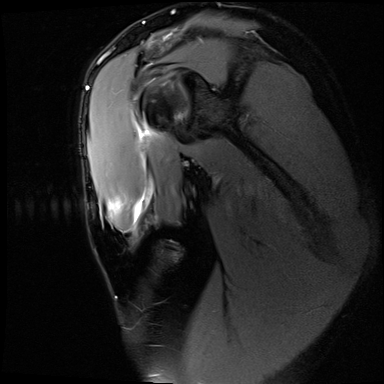
[im 50/50]
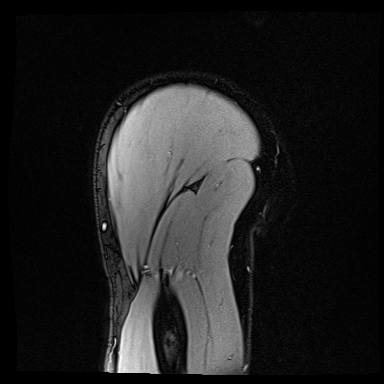

[16 of 16 positions shown; findings below may reference images not displayed]

FINDINGS: Bones/Joint/Cartilage

No marrow signal abnormality. No fracture or dislocation. Normal
alignment. No joint effusion.

Muscles and Tendons
Complete tear of the right pectoralis major tendon with 6 cm of
retraction. Mild muscle edema in the periphery of the pectoralis
major muscle at the musculotendinous junction. Mild tendinosis of
the supraspinatus, infraspinatus and subscapularis tendons. Muscles
are otherwise normal in signal.

Soft tissue
No fluid collection or hematoma.  No soft tissue mass.
IMPRESSION: 1. Complete tear of the right pectoralis major tendon with 6 cm of
retraction.
2. Mild tendinosis of the supraspinatus, infraspinatus and
subscapularis tendons.
